# Patient Record
Sex: Female | Born: 1965 | Race: Black or African American | Hispanic: No | Smoking: Current every day smoker
Health system: Southern US, Community
[De-identification: ages and names within clinical notes are randomized; demographics above are authoritative.]

## PROBLEM LIST (undated history)

## (undated) DIAGNOSIS — I10 Essential (primary) hypertension: Secondary | ICD-10-CM

## (undated) HISTORY — PX: ANKLE SURGERY: SHX546

---

## 2018-04-05 ENCOUNTER — Emergency Department (HOSPITAL_COMMUNITY)
Admission: EM | Admit: 2018-04-05 | Discharge: 2018-04-05 | Disposition: A | Discharge status: Home / Self Care | Payer: Medicaid Other | Attending: Emergency Medicine | Admitting: Emergency Medicine

## 2018-04-05 ENCOUNTER — Encounter (HOSPITAL_COMMUNITY): Payer: Self-pay | Admitting: *Deleted

## 2018-04-05 ENCOUNTER — Other Ambulatory Visit: Payer: Self-pay

## 2018-04-05 DIAGNOSIS — M25511 Pain in right shoulder: Secondary | ICD-10-CM | POA: Insufficient documentation

## 2018-04-05 DIAGNOSIS — Y999 Unspecified external cause status: Secondary | ICD-10-CM | POA: Insufficient documentation

## 2018-04-05 DIAGNOSIS — X500XXA Overexertion from strenuous movement or load, initial encounter: Secondary | ICD-10-CM | POA: Insufficient documentation

## 2018-04-05 DIAGNOSIS — S199XXA Unspecified injury of neck, initial encounter: Secondary | ICD-10-CM | POA: Diagnosis present

## 2018-04-05 DIAGNOSIS — Y9389 Activity, other specified: Secondary | ICD-10-CM | POA: Insufficient documentation

## 2018-04-05 DIAGNOSIS — I10 Essential (primary) hypertension: Secondary | ICD-10-CM | POA: Diagnosis not present

## 2018-04-05 DIAGNOSIS — S161XXA Strain of muscle, fascia and tendon at neck level, initial encounter: Secondary | ICD-10-CM | POA: Insufficient documentation

## 2018-04-05 DIAGNOSIS — Y9289 Other specified places as the place of occurrence of the external cause: Secondary | ICD-10-CM | POA: Diagnosis not present

## 2018-04-05 DIAGNOSIS — F17200 Nicotine dependence, unspecified, uncomplicated: Secondary | ICD-10-CM | POA: Insufficient documentation

## 2018-04-05 HISTORY — DX: Essential (primary) hypertension: I10

## 2018-04-05 MED ORDER — METHOCARBAMOL 500 MG PO TABS
500.0000 mg | ORAL_TABLET | Freq: Three times a day (TID) | ORAL | 0 refills | Status: AC
Start: 2018-04-05 — End: 2018-04-12

## 2018-04-05 MED ORDER — IBUPROFEN 800 MG PO TABS: 800.0000 mg | ORAL_TABLET | Freq: Three times a day (TID) | ORAL | 0 refills | Status: DC

## 2018-04-05 NOTE — ED Triage Notes (Signed)
Pt states she has been sleeping on an air mattress and and has posterior neck pain and now has developed pain that now increases with raising her right arm. Pt reports pain for last 2 weeks.

## 2018-04-05 NOTE — Discharge Instructions (Signed)
Ibuprofen 3 times daily, Robaxin 3 times daily as needed, this medication is sedating and should not be used if you are driving or working.  If you develop severe or worsening weakness or numbness return to the emergency department immediately.

## 2018-04-05 NOTE — ED Provider Notes (Signed)
[facility] EMERGENCY DEPARTMENT Provider Note   CSN: [id number] Arrival date & time: 04/05/18  0932     History   Chief Complaint Chief Complaint  Patient presents with  . Neck Pain    HPI Chelsey Beasley is a 52 y.o. female.  HPI  The patient is a 52 year old female, she has a known history of high blood pressure, she reports that she has been having some pain in the right side of her neck for the last several days after sleeping on a new mattress as well as starting a new job where she is doing some heavy lifting.  There is no numbness or weakness of the arm, the pain is worse with moving her head left to right shrugging her shoulder or using her right arm.  There is no symptoms in the legs, no fever, symptoms are persistent and worse with those movements.  Past Medical History:  Diagnosis Date  . Hypertension     There are no active problems to display for this patient.   Past Surgical History:  Procedure Laterality Date  . ANKLE SURGERY    . CESAREAN SECTION       OB History   None      Home Medications    Prior to Admission medications   Medication Sig Start Date End Date Taking? Authorizing Provider  ibuprofen (ADVIL,MOTRIN) 800 MG tablet Take 1 tablet (800 mg total) by mouth 3 (three) times daily. 04/05/18   Eber Hong, MD  methocarbamol (ROBAXIN) 500 MG tablet Take 1 tablet (500 mg total) by mouth 3 (three) times daily for 7 days. 04/05/18 04/12/18  Eber Hong, MD    Family History No family history on file.  Social History Social History   Tobacco Use  . Smoking status: Current Every Day Smoker  . Smokeless tobacco: Never Used  Substance Use Topics  . Alcohol use: Yes    Comment: occ  . Drug use: Never     Allergies   Patient has no known allergies.   Review of Systems Review of Systems  Constitutional: Negative for fever.  Musculoskeletal: Positive for neck pain.  Skin: Negative for rash.  Neurological: Negative  for weakness and numbness.     Physical Exam Updated Vital Signs BP (!) 134/97 (BP Location: Right Arm)   Pulse 90   Temp 98.3 F (36.8 C) (Oral)   Resp 18   LMP  (LMP Unknown)   SpO2 100%   Physical Exam  Constitutional: She appears well-developed and well-nourished.  HENT:  Head: Normocephalic and atraumatic.  Eyes: Conjunctivae are normal. Right eye exhibits no discharge. Left eye exhibits no discharge.  Pulmonary/Chest: Effort normal. No respiratory distress.  Musculoskeletal: She exhibits tenderness ( Tender to palpation in the right trapezius and shoulder).  Neurological: She is alert. Coordination normal.  No spinal tenderness, there is normal strength and sensation of the bilateral upper extremities in all areas.  Normal strength at the shoulders elbows wrists and hands.  Mild pain with external and internal rotation of the shoulder.  Sensation diffusely normal.  Gait normal, speech normal  Skin: Skin is warm and dry. No rash noted. She is not diaphoretic. No erythema.  Psychiatric: She has a normal mood and affect.  Nursing note and vitals reviewed.    ED Treatments / Results  Labs (all labs ordered are listed, but only abnormal results are displayed) Labs Reviewed - No data to display  EKG None  Radiology No results found.  Procedures Procedures (including critical care time)  Medications Ordered in ED Medications - No data to display   Initial Impression / Assessment and Plan / ED Course  I have reviewed the triage vital signs and the nursing notes.  Pertinent labs & imaging results that were available during my care of the patient were reviewed by me and considered in my medical decision making (see chart for details).     Exam is consistent with more muscular strain that it is with a radiculopathy.  Robaxin and ibuprofen, return precautions given, patient stressed understanding  Final Clinical Impressions(s) / ED Diagnoses   Final diagnoses:    Strain of neck muscle, initial encounter    ED Discharge Orders        Ordered    ibuprofen (ADVIL,MOTRIN) 800 MG tablet  3 times daily     04/05/18 1213    methocarbamol (ROBAXIN) 500 MG tablet  3 times daily     04/05/18 1213       Eber Hong, MD 04/05/18 1215

## 2019-06-18 ENCOUNTER — Emergency Department (HOSPITAL_COMMUNITY)
Admission: EM | Admit: 2019-06-18 | Discharge: 2019-06-18 | Disposition: A | Discharge status: Home / Self Care | Payer: Medicaid Other | Attending: Emergency Medicine | Admitting: Emergency Medicine

## 2019-06-18 ENCOUNTER — Encounter (HOSPITAL_COMMUNITY): Payer: Self-pay

## 2019-06-18 ENCOUNTER — Other Ambulatory Visit: Payer: Self-pay

## 2019-06-18 DIAGNOSIS — I10 Essential (primary) hypertension: Secondary | ICD-10-CM | POA: Diagnosis not present

## 2019-06-18 DIAGNOSIS — Y929 Unspecified place or not applicable: Secondary | ICD-10-CM | POA: Diagnosis not present

## 2019-06-18 DIAGNOSIS — X58XXXA Exposure to other specified factors, initial encounter: Secondary | ICD-10-CM | POA: Insufficient documentation

## 2019-06-18 DIAGNOSIS — F17200 Nicotine dependence, unspecified, uncomplicated: Secondary | ICD-10-CM | POA: Diagnosis not present

## 2019-06-18 DIAGNOSIS — Y939 Activity, unspecified: Secondary | ICD-10-CM | POA: Diagnosis not present

## 2019-06-18 DIAGNOSIS — S161XXA Strain of muscle, fascia and tendon at neck level, initial encounter: Secondary | ICD-10-CM | POA: Diagnosis not present

## 2019-06-18 DIAGNOSIS — Y999 Unspecified external cause status: Secondary | ICD-10-CM | POA: Diagnosis not present

## 2019-06-18 DIAGNOSIS — S199XXA Unspecified injury of neck, initial encounter: Secondary | ICD-10-CM | POA: Diagnosis present

## 2019-06-18 MED ORDER — LISINOPRIL-HYDROCHLOROTHIAZIDE 20-25 MG PO TABS: 1.0000 | ORAL_TABLET | Freq: Every day | ORAL | 0 refills | Status: DC

## 2019-06-18 MED ORDER — METHOCARBAMOL 500 MG PO TABS: 500.0000 mg | ORAL_TABLET | Freq: Two times a day (BID) | ORAL | 0 refills | Status: DC

## 2019-06-18 NOTE — Discharge Instructions (Addendum)
You were seen in the ED today for neck pain Please take muscle relaxer as prescribed. It is recommended to take at nighttime to help you sleep. Do not drive while on this medication.  Continue taking Ibuprofen as needed for the pain You can use heat packs for comfort Follow up with PCP. If you do not have one you can follow up with [facility].

## 2019-06-18 NOTE — ED Triage Notes (Signed)
Patient complains of left sided neck pain x 1 week, pain with ROM and pulling to neck when lifts left arm, no trauma

## 2019-06-18 NOTE — ED Provider Notes (Signed)
[place] EMERGENCY DEPARTMENT Provider Note   CSN: [id number] Arrival date & time: 06/18/19  0749    History   Chief Complaint Chief Complaint  Patient presents with  . neck pain x 1 week    HPI Chelsey Beasley is a 53 y.o. female with PMHx HTN who presents to the ED today complaining of gradual onset, constant, achy/tight, left sided neck pain x 5-6 days. Pt reports that a few days ago she woke up feeling fine but a couple of hours later began having pain. She has applied icy hot patches and taken Tylenol/Aleve without relief. 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 She had similar pain about 1 year ago for which she came to the ED. Reports that the muscle relaxers seemed to work. Denies fever, chills, headache, weakness, numbness, paresthesias, or any other associated symptoms.        Past Medical History:  Diagnosis Date  . Hypertension     There are no active problems to display for this patient.   Past Surgical History:  Procedure Laterality Date  . ANKLE SURGERY    . CESAREAN SECTION       OB History   No obstetric history on file.      Home Medications    Prior to Admission medications   Medication Sig Start Date End Date Taking? Authorizing Provider  ibuprofen (ADVIL,MOTRIN) 800 MG tablet Take 1 tablet (800 mg total) by mouth 3 (three) times daily. 04/05/18   Noemi Chapel, MD  lisinopril-hydrochlorothiazide (ZESTORETIC) 20-25 MG tablet Take 1 tablet by mouth daily. 06/18/19 07/18/19  Eustaquio Maize, PA-C  methocarbamol (ROBAXIN) 500 MG tablet Take 1 tablet (500 mg total) by mouth 2 (two) times daily. 06/18/19   Eustaquio Maize, PA-C    Family History No family history on file.  Social History Social History   Tobacco Use  . Smoking status: Current Every Day Smoker  . Smokeless tobacco: Never Used  Substance Use Topics  . Alcohol use: Yes    Comment: occ  .  Drug use: Never     Allergies   Patient has no known allergies.   Review of Systems Review of Systems  Constitutional: Negative for chills and fever.  Musculoskeletal: Positive for neck pain and neck stiffness. Negative for back pain and gait problem.  Skin: Negative for rash.  Neurological: Negative for weakness, numbness and headaches.     Physical Exam Updated Vital Signs BP (!) 171/103 (BP Location: Right Arm)   Pulse 72   Temp 99.1 F (37.3 C) (Oral)   Resp 18   SpO2 99%   Physical Exam Vitals signs and nursing note reviewed.  Constitutional:      Appearance: She is not ill-appearing.  HENT:     Head: Normocephalic and atraumatic.  Eyes:     Conjunctiva/sclera: Conjunctivae normal.  Neck:     Comments: No C midline spinal tenderness. Tenderness present to the left trapezius muscle. ROM limited to the left due to pain.  Cardiovascular:     Rate and Rhythm: Normal rate and regular rhythm.  Pulmonary:     Effort: Pulmonary effort is normal.     Breath sounds: Normal breath sounds.  Musculoskeletal:     Comments: No T or L midline spinal tenderness. No paraspinal muscle tenderness to palpation. ROM intact to BUEs. Strength and sensation intact. Good distal pulses.   Skin:  General: Skin is warm and dry.     Coloration: Skin is not jaundiced.  Neurological:     Mental Status: She is alert.      ED Treatments / Results  Labs (all labs ordered are listed, but only abnormal results are displayed) Labs Reviewed - No data to display  EKG None  Radiology No results found.  Procedures Procedures (including critical care time)  Medications Ordered in ED Medications - No data to display   Initial Impression / Assessment and Plan / ED Course  I have reviewed the triage vital signs and the nursing notes.  Pertinent labs & imaging results that were available during my care of the patient were reviewed by me and considered in my medical decision making  (see chart for details).   53 year old female who presents to the ED with left lateral neck pain/stiffness. No known injury or overuse. No midline spinal tenderness. No red flag symptoms. Has had similar pain in the past after sleeping wrong. Took muscle relaxers with relief. Pt found to be hypertensive today at 171/103. Reports she has been out of her lisinopril/hctz 20-25 for "awhile." Does not currently have a PCP. Will write for muscle relaxer today as well as a months worth of BP medication. Will give [facility] for primary care follow up. Strict return precautions discussed. She is in agreement with plan at this time and stable for discharge home.        Final Clinical Impressions(s) / ED Diagnoses   Final diagnoses:  Neck strain, initial encounter    ED Discharge Orders         Ordered    methocarbamol (ROBAXIN) 500 MG tablet  2 times daily     06/18/19 0815    lisinopril-hydrochlorothiazide (ZESTORETIC) 20-25 MG tablet  Daily     06/18/19 0820           Tanda RockersVenter, Gail Vendetti, PA-C 06/18/19 1601    Alvira MondaySchlossman, Erin, MD 06/19/19 1109

## 2019-06-18 NOTE — ED Triage Notes (Signed)
Patient has not had her BP meds this am

## 2019-06-22 ENCOUNTER — Emergency Department (HOSPITAL_COMMUNITY)
Admission: EM | Admit: 2019-06-22 | Discharge: 2019-06-22 | Discharge status: Against Medical Advice | Payer: Medicaid Other

## 2019-06-22 ENCOUNTER — Ambulatory Visit (HOSPITAL_COMMUNITY)
Admission: EM | Admit: 2019-06-22 | Discharge: 2019-06-22 | Disposition: A | Discharge status: Home / Self Care | Payer: Medicaid Other | Attending: Physician Assistant | Admitting: Physician Assistant

## 2019-06-22 ENCOUNTER — Encounter (HOSPITAL_COMMUNITY): Payer: Self-pay

## 2019-06-22 ENCOUNTER — Other Ambulatory Visit: Payer: Self-pay

## 2019-06-22 ENCOUNTER — Ambulatory Visit (INDEPENDENT_AMBULATORY_CARE_PROVIDER_SITE_OTHER): Payer: Medicaid Other

## 2019-06-22 DIAGNOSIS — M542 Cervicalgia: Secondary | ICD-10-CM

## 2019-06-22 DIAGNOSIS — M436 Torticollis: Secondary | ICD-10-CM

## 2019-06-22 MED ORDER — KETOROLAC TROMETHAMINE 60 MG/2ML IM SOLN
INTRAMUSCULAR | Status: AC
  Filled 2019-06-22: qty 2

## 2019-06-22 MED ORDER — METHYLPREDNISOLONE SODIUM SUCC 125 MG IJ SOLR
INTRAMUSCULAR | Status: AC
  Filled 2019-06-22: qty 2

## 2019-06-22 MED ORDER — PREDNISONE 10 MG PO TABS: ORAL_TABLET | ORAL | 0 refills | Status: DC

## 2019-06-22 MED ORDER — METHYLPREDNISOLONE SODIUM SUCC 125 MG IJ SOLR
125.0000 mg | Freq: Once | INTRAMUSCULAR | Status: AC
  Administered 2019-06-22: 125 mg via INTRAMUSCULAR

## 2019-06-22 MED ORDER — KETOROLAC TROMETHAMINE 60 MG/2ML IM SOLN
60.0000 mg | Freq: Once | INTRAMUSCULAR | Status: AC
  Administered 2019-06-22: 60 mg via INTRAMUSCULAR

## 2019-06-22 NOTE — ED Notes (Signed)
Called pt for triage. No response.  

## 2019-06-22 NOTE — ED Triage Notes (Signed)
Pt states she has neck pain and this has been going on for 1 week or more. Pt went to the ER on 06/18/19. Pt states the meds did not working for her pain.

## 2019-06-22 NOTE — Discharge Instructions (Addendum)
Return if any problems.  See the Orthopaedist for evaluation  °

## 2019-06-25 NOTE — ED Provider Notes (Signed)
[place]    CSN: [id number] Arrival date & time: 06/22/19  1528      History   Chief Complaint Chief Complaint  Patient presents with  . Neck Pain    HPI Dayanis Bergquist is a 53 y.o. female.   The history is provided by the patient. No language interpreter was used.  Neck Pain Pain location:  Generalized neck Quality:  Aching Pain severity:  Moderate Pain is:  Worse during the day Timing:  Constant Progression:  Worsening Chronicity:  New Relieved by:  Nothing Worsened by:  Nothing Ineffective treatments:  None tried Associated symptoms: headaches     Past Medical History:  Diagnosis Date  . Hypertension     There are no active problems to display for this patient.   Past Surgical History:  Procedure Laterality Date  . ANKLE SURGERY    . CESAREAN SECTION      OB History   No obstetric history on file.      Home Medications    Prior to Admission medications   Medication Sig Start Date End Date Taking? Authorizing Provider  ibuprofen (ADVIL,MOTRIN) 800 MG tablet Take 1 tablet (800 mg total) by mouth 3 (three) times daily. 04/05/18   Noemi Chapel, MD  lisinopril-hydrochlorothiazide (ZESTORETIC) 20-25 MG tablet Take 1 tablet by mouth daily. 06/18/19 07/18/19  Eustaquio Maize, PA-C  methocarbamol (ROBAXIN) 500 MG tablet Take 1 tablet (500 mg total) by mouth 2 (two) times daily. 06/18/19   Eustaquio Maize, PA-C  predniSONE (DELTASONE) 10 MG tablet 6,5,4,3,2,1 taper 06/22/19   Fransico Meadow, PA-C    Family History History reviewed. No pertinent family history.  Social History Social History   Tobacco Use  . Smoking status: Current Every Day Smoker  . Smokeless tobacco: Never Used  Substance Use Topics  . Alcohol use: Yes    Comment: occ  . Drug use: Never     Allergies   Patient has no known allergies.   Review of Systems Review of Systems  Musculoskeletal: Positive for neck pain.  Neurological: Positive for headaches.  All other  systems reviewed and are negative.    Physical Exam Triage Vital Signs ED Triage Vitals  Enc Vitals Group     BP 06/22/19 1544 124/84     Pulse Rate 06/22/19 1544 72     Resp 06/22/19 1544 18     Temp --      Temp src --      SpO2 06/22/19 1544 100 %     Weight 06/22/19 1542 150 lb (68 kg)     Height --      Head Circumference --      Peak Flow --      Pain Score 06/22/19 1542 10     Pain Loc --      Pain Edu? --      Excl. in [place]? --    No data found.  Updated Vital Signs BP 124/84 (BP Location: Right Arm)   Pulse 72   Resp 18   Wt 68 kg   SpO2 100%   Visual Acuity Right Eye Distance:   Left Eye Distance:   Bilateral Distance:    Right Eye Near:   Left Eye Near:    Bilateral Near:     Physical Exam Vitals signs and nursing note reviewed.  Constitutional:      Appearance: She is well-developed.  HENT:     Head: Normocephalic.  Neck:     Musculoskeletal:  Normal range of motion. Muscular tenderness present.     Comments: Soreness trapezius area, stiffness with movement Cardiovascular:     Rate and Rhythm: Normal rate.  Pulmonary:     Effort: Pulmonary effort is normal.  Abdominal:     General: There is no distension.  Musculoskeletal: Normal range of motion.  Neurological:     Mental Status: She is alert and oriented to person, place, and time.      UC Treatments / Results  Labs (all labs ordered are listed, but only abnormal results are displayed) Labs Reviewed - No data to display  EKG   Radiology No results found.  Procedures Procedures (including critical care time)  Medications Ordered in UC Medications  ketorolac (TORADOL) injection 60 mg (60 mg Intramuscular Given 06/22/19 1716)  methylPREDNISolone sodium succinate (SOLU-MEDROL) 125 mg/2 mL injection 125 mg (125 mg Intramuscular Given 06/22/19 1716)  methylPREDNISolone sodium succinate (SOLU-MEDROL) 125 mg/2 mL injection (has no administration in time range)  ketorolac (TORADOL) 60  MG/2ML injection (has no administration in time range)    Initial Impression / Assessment and Plan / UC Course  I have reviewed the triage vital signs and the nursing notes.  Pertinent labs & imaging results that were available during my care of the patient were reviewed by me and considered in my medical decision making (see chart for details).     MDM  Pt counseled on treatment and referral  Final Clinical Impressions(s) / UC Diagnoses   Final diagnoses:  Neck pain  Torticollis     Discharge Instructions     Return if any problems.  See the Orthopaedist for evaluation    ED Prescriptions    Medication Sig Dispense Auth. Provider   predniSONE (DELTASONE) 10 MG tablet 6,5,4,3,2,1 taper 30 tablet Elson AreasSofia, Kanon Colunga K, [place]-C     Controlled Substance Prescriptions [place] Controlled Substance Registry consulted? Not Applicable   Elson AreasSofia, Shannie Kontos K, [place]-C 06/25/19 [id number]

## 2021-03-01 IMAGING — DX CERVICAL SPINE - COMPLETE 4+ VIEW
8 series · 8 of 8 positions shown · non-contrast
Comparison: None.

CLINICAL DATA: 53-year-old female with left side neck pain for
greater than 1 week with no known injury.

EXAM:
CERVICAL SPINE - COMPLETE 4+ VIEW

[c-spine lat]
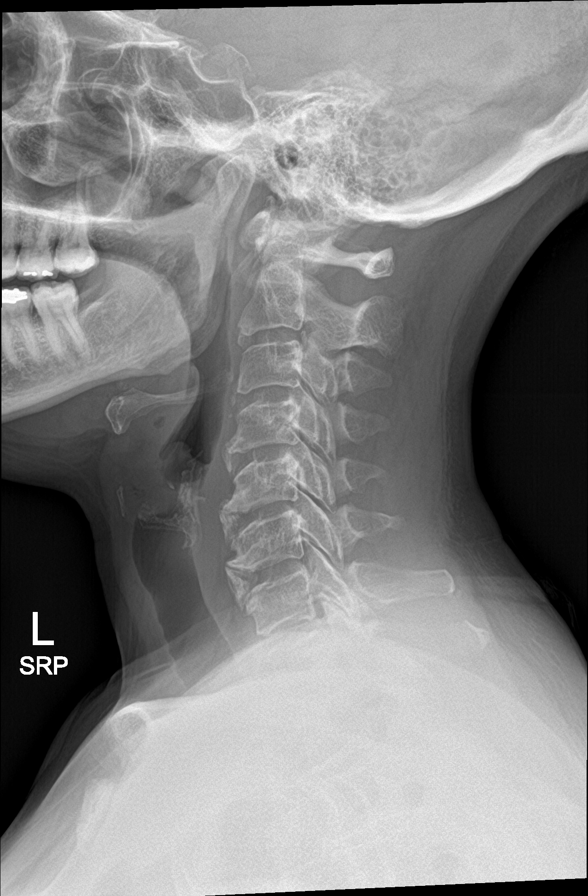

[c-spine obl (1 of 2)]
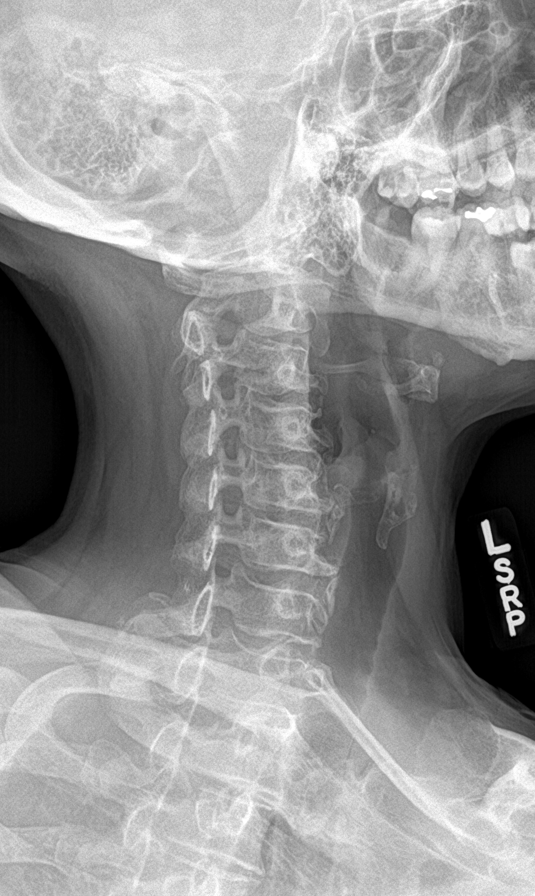

[c-spine obl (2 of 2)]
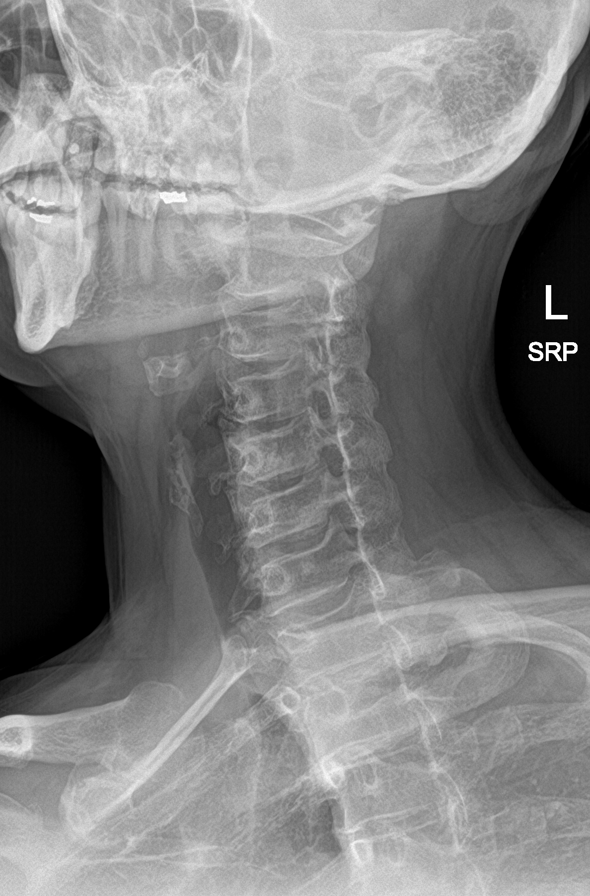

[c-spine ap]
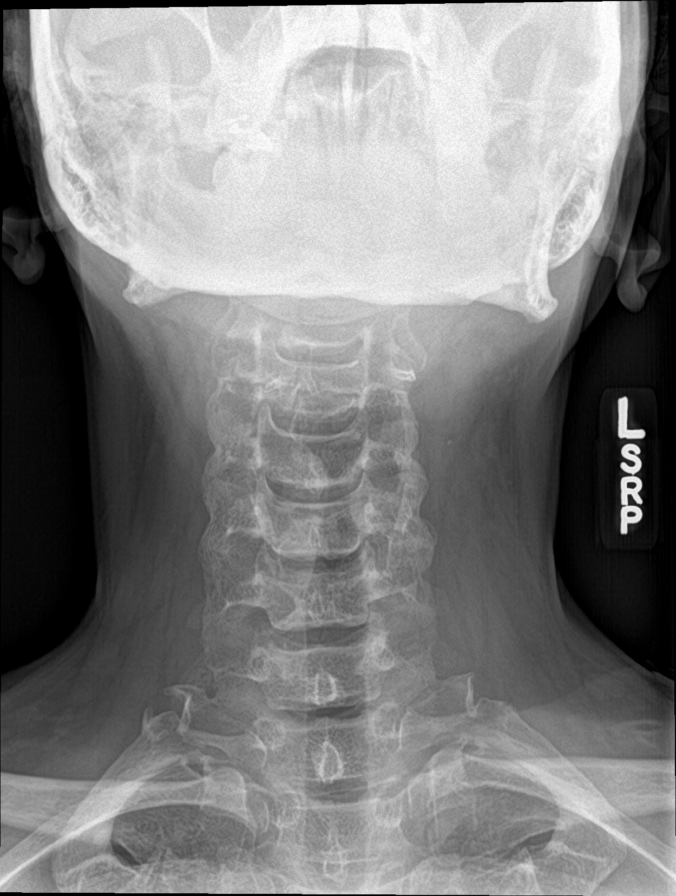

[c-spine open mouth (1 of 3)]
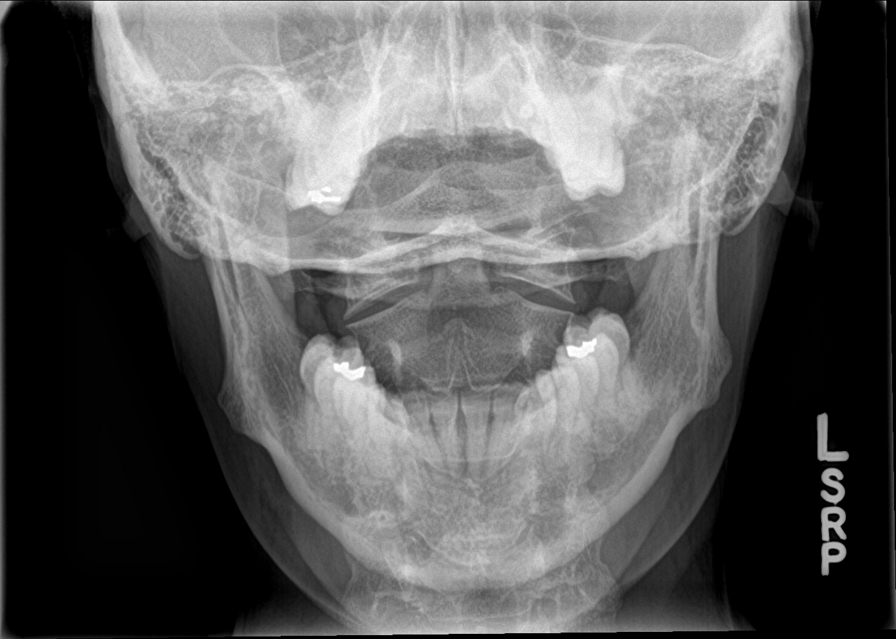

[c-spine swimmers]
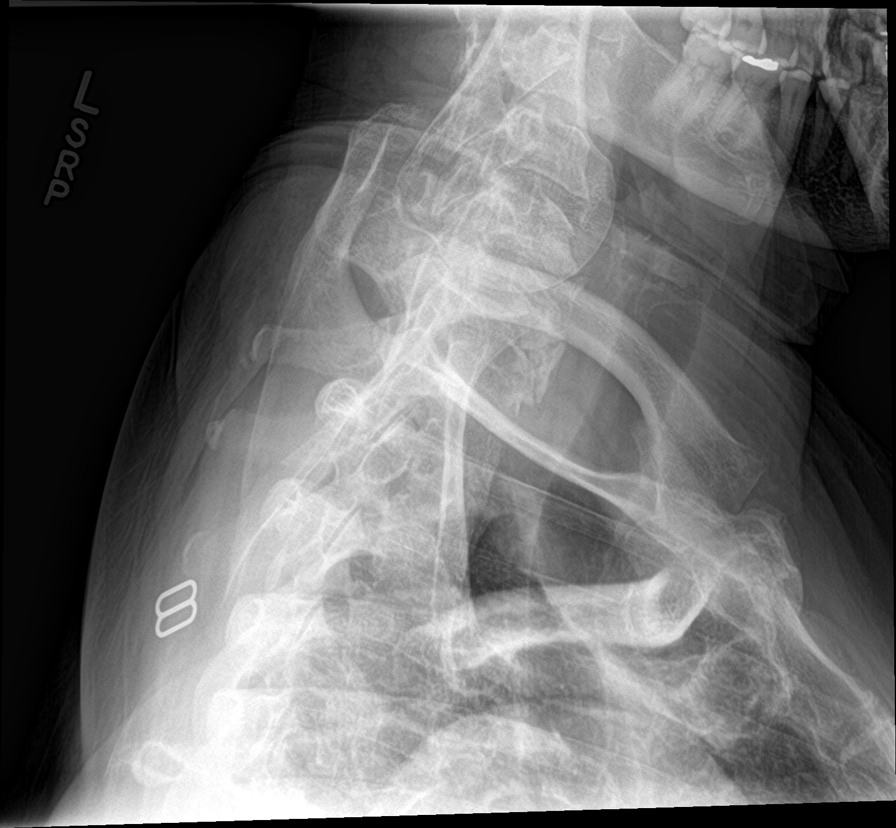

[c-spine open mouth (2 of 3)]
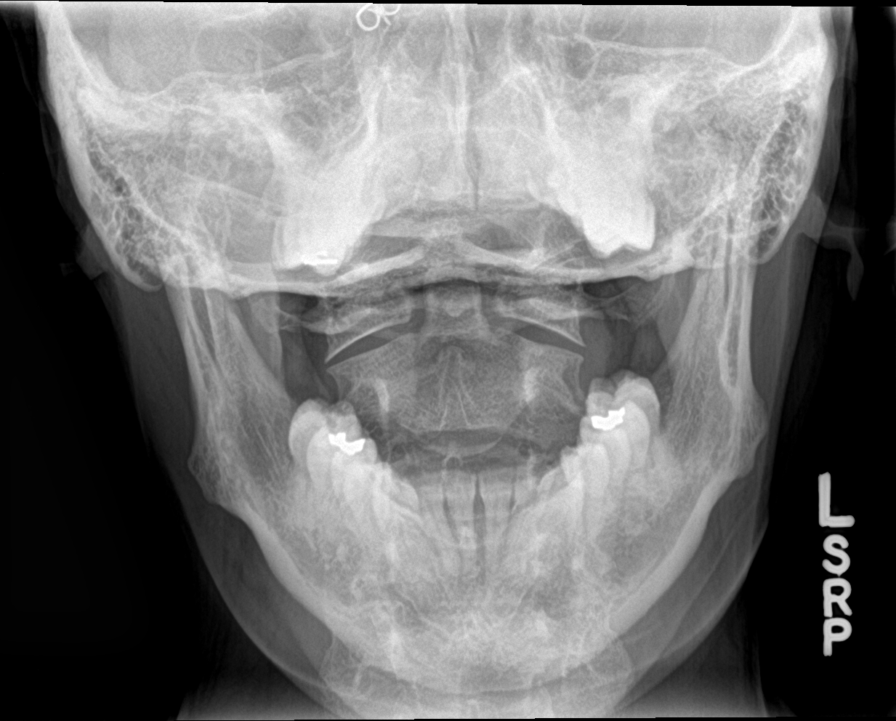

[c-spine open mouth (3 of 3)]
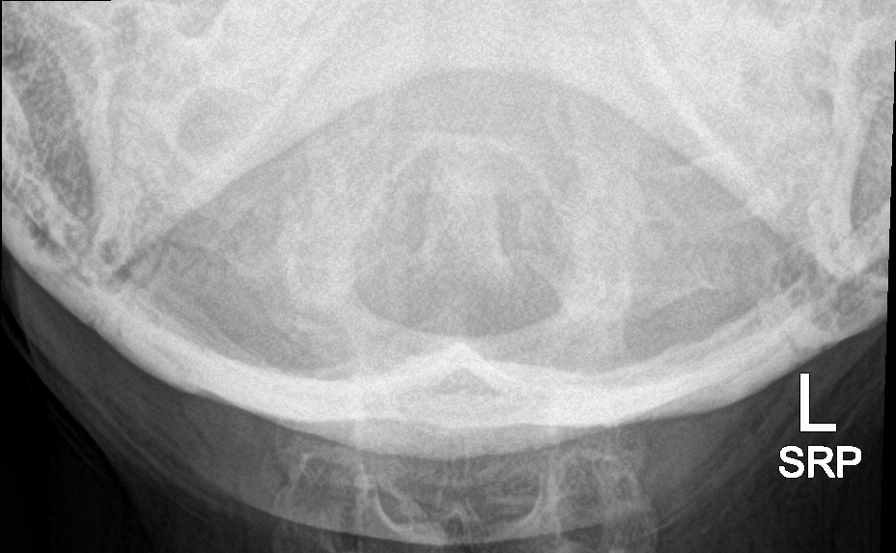

[8 of 8 positions shown; findings below may reference images not displayed]

FINDINGS: Mild straightening of cervical lordosis. Normal prevertebral soft
tissue contour. Bilateral posterior element alignment is within
normal limits. Cervicothoracic junction alignment is within normal
limits. Relatively preserved disc spaces, but bulky anterior
endplate spurring C4 through C7. Normal C1-C2 alignment and joint
spaces. Slight dextroconvex curvature of the cervical spine. No
acute osseous abnormality identified. Negative visible upper chest.
IMPRESSION: 1. No acute osseous abnormality identified in the cervical spine.
2. Bulky anterior endplate osteophytes suggesting Diffuse idiopathic
skeletal hyperostosis (DISH).

## 2022-06-17 ENCOUNTER — Telehealth: Payer: Self-pay

## 2022-06-17 ENCOUNTER — Ambulatory Visit
Admission: EM | Admit: 2022-06-17 | Discharge: 2022-06-17 | Disposition: A | Discharge status: Home / Self Care | Payer: Medicaid Other | Attending: Physician Assistant | Admitting: Physician Assistant

## 2022-06-17 DIAGNOSIS — R519 Headache, unspecified: Secondary | ICD-10-CM

## 2022-06-17 MED ORDER — LISINOPRIL-HYDROCHLOROTHIAZIDE 20-25 MG PO TABS: 1.0000 | ORAL_TABLET | Freq: Every day | ORAL | 0 refills | Status: DC

## 2022-06-17 MED ORDER — KETOROLAC TROMETHAMINE 30 MG/ML IJ SOLN
30.0000 mg | Freq: Once | INTRAMUSCULAR | Status: AC
  Administered 2022-06-17: 30 mg via INTRAMUSCULAR

## 2022-06-17 NOTE — ED Provider Notes (Signed)
[facility]    CSN: [id number] Arrival date & time: 06/17/22  1247      History   Chief Complaint Chief Complaint  Patient presents with   Headache    HPI Chelsey Beasley is a 56 y.o. female.   Patient here today for evaluation of frontal headache she first noticed 3 days ago. She does have history of migraines but has not had one recently. She reports current symptoms are similar to prior migraines as she has photosensitivity. She was concerned her headache was related to her blood pressure being elevated as she needs refill of her blood pressure managing medication. She denies any chest pain or shortness of breath. She has not had any numbness, tingling or weakness. She has not taken any medication for treatment because she reports she has not had any treatment at home.   The history is provided by the patient.    Past Medical History:  Diagnosis Date   Hypertension     There are no problems to display for this patient.   Past Surgical History:  Procedure Laterality Date   ANKLE SURGERY     CESAREAN SECTION      OB History   No obstetric history on file.      Home Medications    Prior to Admission medications   Medication Sig Start Date End Date Taking? Authorizing Provider  ibuprofen (ADVIL,MOTRIN) 800 MG tablet Take 1 tablet (800 mg total) by mouth 3 (three) times daily. 04/05/18   Eber Hong, MD  lisinopril-hydrochlorothiazide (ZESTORETIC) 20-25 MG tablet Take 1 tablet by mouth daily. 06/17/22 07/17/22  Tomi Bamberger, PA-C  methocarbamol (ROBAXIN) 500 MG tablet Take 1 tablet (500 mg total) by mouth 2 (two) times daily. 06/18/19   Tanda Rockers, PA-C  predniSONE (DELTASONE) 10 MG tablet 6,5,4,3,2,1 taper 06/22/19   Elson Areas, PA-C    Family History History reviewed. No pertinent family history.  Social History Social History   Tobacco Use   Smoking status: Every Day   Smokeless tobacco: Never  Substance Use Topics   Alcohol use: Yes     Comment: occ   Drug use: Never     Allergies   Patient has no known allergies.   Review of Systems Review of Systems  Constitutional:  Negative for chills and fever.  Eyes:  Positive for photophobia. Negative for discharge and redness.  Respiratory:  Negative for shortness of breath.   Cardiovascular:  Negative for chest pain.  Gastrointestinal:  Negative for abdominal pain, nausea and vomiting.  Neurological:  Positive for headaches. Negative for facial asymmetry, weakness and numbness.     Physical Exam Triage Vital Signs ED Triage Vitals  Enc Vitals Group     BP      Pulse      Resp      Temp      Temp src      SpO2      Weight      Height      Head Circumference      Peak Flow      Pain Score      Pain Loc      Pain Edu?      Excl. in [place]?    No data found.  Updated Vital Signs BP 137/88 (BP Location: Right Arm)   Pulse 78   Temp 98 F (36.7 C) (Oral)   Resp 18   LMP  (LMP Unknown)   SpO2 98%  Physical Exam Vitals and nursing note reviewed.  Constitutional:      General: She is not in acute distress.    Appearance: Normal appearance. She is not ill-appearing.  HENT:     Head: Normocephalic and atraumatic.  Eyes:     Extraocular Movements: Extraocular movements intact.     Conjunctiva/sclera: Conjunctivae normal.     Pupils: Pupils are equal, round, and reactive to light.  Cardiovascular:     Rate and Rhythm: Normal rate and regular rhythm.     Heart sounds: Normal heart sounds.  Pulmonary:     Effort: Pulmonary effort is normal. No respiratory distress.     Breath sounds: Normal breath sounds. No wheezing, rhonchi or rales.  Neurological:     Mental Status: She is alert and oriented to person, place, and time.  Psychiatric:        Mood and Affect: Mood normal.        Behavior: Behavior normal.        Thought Content: Thought content normal.      UC Treatments / Results  Labs (all labs ordered are listed, but only abnormal results  are displayed) Labs Reviewed - No data to display  EKG   Radiology No results found.  Procedures Procedures (including critical care time)  Medications Ordered in UC Medications  ketorolac (TORADOL) 30 MG/ML injection 30 mg (30 mg Intramuscular Given 06/17/22 1325)    Initial Impression / Assessment and Plan / UC Course  I have reviewed the triage vital signs and the nursing notes.  Pertinent labs & imaging results that were available during my care of the patient were reviewed by me and considered in my medical decision making (see chart for details).    Toradol injection administered in office and antihypertensive refilled. Encouraged follow up with any persistent symptoms and advised she report to ED with any worsening symptoms.  Final Clinical Impressions(s) / UC Diagnoses   Final diagnoses:  Acute nonintractable headache, unspecified headache type   Discharge Instructions   None    ED Prescriptions     Medication Sig Dispense Auth. Provider   lisinopril-hydrochlorothiazide (ZESTORETIC) 20-25 MG tablet Take 1 tablet by mouth daily. 30 tablet Tomi Bamberger, PA-C      PDMP not reviewed this encounter.   Tomi Bamberger, PA-C 06/17/22 1413

## 2022-06-17 NOTE — ED Triage Notes (Signed)
The patient c/o headache and states her BP might be high. The patient states she has not been taking her Zestoretic, needs refill.   Started: a few days ago (Sunday)

## 2022-10-22 ENCOUNTER — Ambulatory Visit
Admission: EM | Admit: 2022-10-22 | Discharge: 2022-10-22 | Disposition: A | Discharge status: Home / Self Care | Payer: Medicaid Other

## 2022-10-22 DIAGNOSIS — M659 Synovitis and tenosynovitis, unspecified: Secondary | ICD-10-CM

## 2022-10-22 NOTE — ED Triage Notes (Signed)
The patient states she has been having pain and lack of mobility (intermittently) to the right thumb.   Started: a few weeks ago    Home interventions: none

## 2022-10-27 DIAGNOSIS — M79644 Pain in right finger(s): Secondary | ICD-10-CM | POA: Diagnosis not present

## 2022-11-26 DIAGNOSIS — M65311 Trigger thumb, right thumb: Secondary | ICD-10-CM | POA: Diagnosis not present

## 2022-12-14 ENCOUNTER — Ambulatory Visit
Admission: EM | Admit: 2022-12-14 | Discharge: 2022-12-14 | Disposition: A | Discharge status: Home / Self Care | Payer: Medicaid Other | Attending: Urgent Care | Admitting: Urgent Care

## 2022-12-14 DIAGNOSIS — J40 Bronchitis, not specified as acute or chronic: Secondary | ICD-10-CM | POA: Diagnosis not present

## 2022-12-14 DIAGNOSIS — J329 Chronic sinusitis, unspecified: Secondary | ICD-10-CM

## 2022-12-14 DIAGNOSIS — F17200 Nicotine dependence, unspecified, uncomplicated: Secondary | ICD-10-CM

## 2022-12-14 MED ORDER — AMOXICILLIN-POT CLAVULANATE 875-125 MG PO TABS: 1.0000 | ORAL_TABLET | Freq: Two times a day (BID) | ORAL | 0 refills | Status: DC

## 2022-12-14 MED ORDER — PROMETHAZINE-DM 6.25-15 MG/5ML PO SYRP: 5.0000 mL | ORAL_SOLUTION | Freq: Three times a day (TID) | ORAL | 0 refills | Status: DC | PRN

## 2022-12-14 MED ORDER — BENZONATATE 100 MG PO CAPS: 100.0000 mg | ORAL_CAPSULE | Freq: Three times a day (TID) | ORAL | 0 refills | Status: DC | PRN

## 2022-12-14 MED ORDER — FLUTICASONE PROPIONATE 50 MCG/ACT NA SUSP: 2.0000 | Freq: Every day | NASAL | 12 refills | Status: DC

## 2022-12-14 MED ORDER — CETIRIZINE HCL 10 MG PO TABS: 10.0000 mg | ORAL_TABLET | Freq: Every day | ORAL | 0 refills | Status: DC

## 2022-12-14 MED ORDER — PREDNISONE 50 MG PO TABS: 50.0000 mg | ORAL_TABLET | Freq: Every day | ORAL | 0 refills | Status: DC

## 2022-12-14 MED ORDER — PSEUDOEPHEDRINE HCL 60 MG PO TABS: 60.0000 mg | ORAL_TABLET | Freq: Three times a day (TID) | ORAL | 0 refills | Status: DC | PRN

## 2022-12-14 NOTE — ED Triage Notes (Signed)
Pt c/p prod cough, nasal congestion, diarrhea, HA-sx intermittent x 1 month-NAD-steady gait

## 2022-12-14 NOTE — ED Provider Notes (Signed)
[facility] URGENT CARE CENTER  Note:  This document was prepared using Systems analyst and may include unintentional dictation errors.  MRN: [id number] DOB: 23-May-1966  Subjective:   Chelsey Beasley is a 57 y.o. female presenting for 1 month history of persistent sinus congestion, productive cough, chills, sinus pain, sweats, diarrhea, sinus headaches, shob, wheezing. No fever, chest pain. Has a history of bronchitis. Smokes 1ppd, marijuana once weekly.   No current facility-administered medications for this encounter.  Current Outpatient Medications:    ibuprofen (ADVIL,MOTRIN) 800 MG tablet, Take 1 tablet (800 mg total) by mouth 3 (three) times daily., Disp: 21 tablet, Rfl: 0   No Known Allergies  Past Medical History:  Diagnosis Date   Hypertension      Past Surgical History:  Procedure Laterality Date   ANKLE SURGERY     CESAREAN SECTION      No family history on file.  Social History   Tobacco Use   Smoking status: Every Day    Types: Cigarettes   Smokeless tobacco: Never  Vaping Use   Vaping Use: Never used  Substance Use Topics   Alcohol use: Yes    Comment: occ   Drug use: Yes    Types: Marijuana    ROS   Objective:   Vitals: BP 131/86 (BP Location: Right Arm)   Pulse 80   Temp 98.6 F (37 C) (Oral)   Resp 18   LMP  (LMP Unknown)   SpO2 97%   Physical Exam Constitutional:      General: She is not in acute distress.    Appearance: Normal appearance. She is well-developed and normal weight. She is not ill-appearing, toxic-appearing or diaphoretic.  HENT:     Head: Normocephalic and atraumatic.     Right Ear: Tympanic membrane, ear canal and external ear normal. No drainage or tenderness. No middle ear effusion. There is no impacted cerumen. Tympanic membrane is not erythematous or bulging.     Left Ear: Tympanic membrane, ear canal and external ear normal. No drainage or tenderness.  No middle ear effusion. There is no  impacted cerumen. Tympanic membrane is not erythematous or bulging.     Nose: Nose normal. No congestion or rhinorrhea.     Mouth/Throat:     Mouth: Mucous membranes are moist. No oral lesions.     Pharynx: No pharyngeal swelling, oropharyngeal exudate, posterior oropharyngeal erythema or uvula swelling.     Tonsils: No tonsillar exudate or tonsillar abscesses.  Eyes:     General: No scleral icterus.       Right eye: No discharge.        Left eye: No discharge.     Extraocular Movements: Extraocular movements intact.     Right eye: Normal extraocular motion.     Left eye: Normal extraocular motion.     Conjunctiva/sclera: Conjunctivae normal.  Cardiovascular:     Rate and Rhythm: Normal rate and regular rhythm.     Heart sounds: Normal heart sounds. No murmur heard.    No friction rub. No gallop.  Pulmonary:     Effort: Pulmonary effort is normal. No respiratory distress.     Breath sounds: No stridor. No wheezing, rhonchi or rales.  Chest:     Chest wall: No tenderness.  Musculoskeletal:     Cervical back: Normal range of motion and neck supple.  Lymphadenopathy:     Cervical: No cervical adenopathy.  Skin:    General: Skin is warm and dry.  Neurological:     General: No focal deficit present.     Mental Status: She is alert and oriented to person, place, and time.  Psychiatric:        Mood and Affect: Mood normal.        Behavior: Behavior normal.     Assessment and Plan :   PDMP not reviewed this encounter.  1. Sinobronchitis   2. Smoker     Deferred imaging given clear cardiopulmonary exam, hemodynamically stable vital signs. Will manage for sinobronchitis with Augmentin, prednisone and general supportive care.  Start Zyrtec and Flonase for daily maintenance of underlying allergic rhinitis secondary to her smoking.  Advised that she start the Flonase when she finishes Augmentin.  Can use pseudoephedrine for supportive care after she finishes prednisone.  Counseled  patient on potential for adverse effects with medications prescribed/recommended today, ER and return-to-clinic precautions discussed, patient verbalized understanding.    Jaynee Eagles, PA-C 12/14/22 1101

## 2022-12-14 NOTE — Discharge Instructions (Addendum)
You have an infection of the sinuses causing bronchitis. Start amoxicillin-clavulanate, prednisone, Zyrtec today. Take Zyrtec every day long term to help your sinuses as long as you are a smoker. Once you finish the 5 days of prednisone you can start using pseudoephedrine as needed. After you finish the amoxicillin, start using Flonase every day as long as you are a smoker (like you will be using Zyrtec). Use the cough medications as needed.

## 2022-12-21 DIAGNOSIS — M47812 Spondylosis without myelopathy or radiculopathy, cervical region: Secondary | ICD-10-CM | POA: Diagnosis not present

## 2022-12-21 DIAGNOSIS — M5412 Radiculopathy, cervical region: Secondary | ICD-10-CM | POA: Diagnosis not present

## 2023-03-19 DIAGNOSIS — M65311 Trigger thumb, right thumb: Secondary | ICD-10-CM | POA: Diagnosis not present

## 2023-04-15 ENCOUNTER — Telehealth: Payer: 59 | Admitting: Physician Assistant

## 2023-04-15 DIAGNOSIS — H66001 Acute suppurative otitis media without spontaneous rupture of ear drum, right ear: Secondary | ICD-10-CM | POA: Diagnosis not present

## 2023-04-15 DIAGNOSIS — J0110 Acute frontal sinusitis, unspecified: Secondary | ICD-10-CM | POA: Diagnosis not present

## 2023-04-15 MED ORDER — BENZONATATE 100 MG PO CAPS: 100.0000 mg | ORAL_CAPSULE | Freq: Three times a day (TID) | ORAL | 0 refills | Status: AC | PRN

## 2023-04-15 MED ORDER — AMOXICILLIN 875 MG PO TABS: 875.0000 mg | ORAL_TABLET | Freq: Two times a day (BID) | ORAL | 0 refills | Status: AC

## 2023-04-15 NOTE — Progress Notes (Signed)
Virtual Visit Consent   Chelsey Beasley, you are scheduled for a virtual visit with a [facility] provider today. Just as with appointments in the office, your consent must be obtained to participate. Your consent will be active for this visit and any virtual visit you may have with one of our providers in the next 365 days. If you have a MyChart account, a copy of this consent can be sent to you electronically.  As this is a virtual visit, video technology does not allow for your provider to perform a traditional examination. This may limit your provider's ability to fully assess your condition. If your provider identifies any concerns that need to be evaluated in person or the need to arrange testing (such as labs, EKG, etc.), we will make arrangements to do so. Although advances in technology are sophisticated, we cannot ensure that it will always work on either your end or our end. If the connection with a video visit is poor, the visit may have to be switched to a telephone visit. With either a video or telephone visit, we are not always able to ensure that we have a secure connection.  By engaging in this virtual visit, you consent to the provision of healthcare and authorize for your insurance to be billed (if applicable) for the services provided during this visit. Depending on your insurance coverage, you may receive a charge related to this service.  I need to obtain your verbal consent now. Are you willing to proceed with your visit today? Chelsey Beasley has provided verbal consent on 04/15/2023 for a virtual visit (video or telephone). Chelsey Beasley, [place]  Date: 04/15/2023 12:08 PM  Virtual Visit via Video Note   I, Chelsey Beasley, connected with  Chelsey Beasley  ([id number], 1966/03/25) on 04/15/23 at 12:00 PM EDT by a video-enabled telemedicine application and verified that I am speaking with the correct person using two identifiers.  Location: Patient: Virtual Visit  Location Patient: Home Provider: Virtual Visit Location Provider: Home Office   I discussed the limitations of evaluation and management by telemedicine and the availability of in person appointments. The patient expressed understanding and agreed to proceed.    History of Present Illness: Chelsey Beasley is a 57 y.o. who identifies as a female who was assigned female at birth, and is being seen today for concern of ear infection after onset of R-sided ear pain, pressure, congestion and fatigue with low-grade fever. Some associated cough with chest congestion. Notes some tenderness with coughing. Denies recent travel or sick contact.   OTC -- Tylenol, Robitussin  HPI: HPI  Problems: There are no problems to display for this patient.   Allergies: No Known Allergies Medications:  Current Outpatient Medications:    amoxicillin (AMOXIL) 875 MG tablet, Take 1 tablet (875 mg total) by mouth 2 (two) times daily for 10 days., Disp: 20 tablet, Rfl: 0  Observations/Objective: Patient is well-developed, well-nourished in no acute distress.  Resting comfortably at home.  Head is normocephalic, atraumatic.  No labored breathing. Speech is clear and coherent with logical content.  Patient is alert and oriented at baseline.   Assessment and Plan: 1. Non-recurrent acute suppurative otitis media of right ear without spontaneous rupture of tympanic membrane - amoxicillin (AMOXIL) 875 MG tablet; Take 1 tablet (875 mg total) by mouth 2 (two) times daily for 10 days.  Dispense: 20 tablet; Refill: 0  2. Acute non-recurrent frontal sinusitis  Rx Amoxicillin per orders. Will send in generic benzonatate to  start as directed.  Increase fluids.  Rest.  Saline nasal spray.  Probiotic.  Mucinex as directed.  Humidifier in bedroom.  Call or return to clinic if symptoms are not improving.   Follow Up Instructions: I discussed the assessment and treatment plan with the patient. The patient was provided an  opportunity to ask questions and all were answered. The patient agreed with the plan and demonstrated an understanding of the instructions.  A copy of instructions were sent to the patient via MyChart unless otherwise noted below.   The patient was advised to call back or seek an in-person evaluation if the symptoms worsen or if the condition fails to improve as anticipated.  Time:  I spent 10 minutes with the patient via telehealth technology discussing the above problems/concerns.    Chelsey Climes, PA-C

## 2023-04-15 NOTE — Patient Instructions (Signed)
Jonah Blue, thank you for joining Piedad Climes, PA-C for today's virtual visit.  While this provider is not your primary care provider (PCP), if your PCP is located in our provider database this encounter information will be shared with them immediately following your visit.   A [facility] MyChart account gives you access to today's visit and all your visits, tests, and labs performed at [facility] " click here if you don't have a [facility] MyChart account or go to [email or web address]  Consent: (Patient) Chelsey Beasley provided verbal consent for this virtual visit at the beginning of the encounter.  Current Medications:  Current Outpatient Medications:    amoxicillin-clavulanate (AUGMENTIN) 875-125 MG tablet, Take 1 tablet by mouth 2 (two) times daily., Disp: 20 tablet, Rfl: 0   benzonatate (TESSALON) 100 MG capsule, Take 1 capsule (100 mg total) by mouth 3 (three) times daily as needed for cough., Disp: 30 capsule, Rfl: 0   cetirizine (ZYRTEC ALLERGY) 10 MG tablet, Take 1 tablet (10 mg total) by mouth daily., Disp: 90 tablet, Rfl: 0   fluticasone (FLONASE) 50 MCG/ACT nasal spray, Place 2 sprays into both nostrils daily., Disp: 16 g, Rfl: 12   ibuprofen (ADVIL,MOTRIN) 800 MG tablet, Take 1 tablet (800 mg total) by mouth 3 (three) times daily., Disp: 21 tablet, Rfl: 0   predniSONE (DELTASONE) 50 MG tablet, Take 1 tablet (50 mg total) by mouth daily with breakfast., Disp: 5 tablet, Rfl: 0   promethazine-dextromethorphan (PROMETHAZINE-DM) 6.25-15 MG/5ML syrup, Take 5 mLs by mouth 3 (three) times daily as needed for cough., Disp: 200 mL, Rfl: 0   pseudoephedrine (SUDAFED) 60 MG tablet, Take 1 tablet (60 mg total) by mouth every 8 (eight) hours as needed for congestion., Disp: 30 tablet, Rfl: 0   Medications ordered in this encounter:  No orders of the defined types were placed in this encounter.    *If you need refills on other medications prior to your next  appointment, please contact your pharmacy*  Follow-Up: Call back or seek an in-person evaluation if the symptoms worsen or if the condition fails to improve as anticipated.  [facility] [phone]  Other Instructions Please take antibiotic as directed.  Increase fluid intake.  Use Saline nasal spray.  Take a daily multivitamin. Take the Tessalon for cough. If not covered by insurance you can go to goodrx.com for coupon, or can start Mucinex-DM over the counter.  Place a humidifier in the bedroom.  Please call or return clinic if symptoms are not improving.  Sinusitis Sinusitis is redness, soreness, and swelling (inflammation) of the paranasal sinuses. Paranasal sinuses are air pockets within the bones of your face (beneath the eyes, the middle of the forehead, or above the eyes). In healthy paranasal sinuses, mucus is able to drain out, and air is able to circulate through them by way of your nose. However, when your paranasal sinuses are inflamed, mucus and air can become trapped. This can allow bacteria and other germs to grow and cause infection. Sinusitis can develop quickly and last only a short time (acute) or continue over a long period (chronic). Sinusitis that lasts for more than 12 weeks is considered chronic.  CAUSES  Causes of sinusitis include: Allergies. Structural abnormalities, such as displacement of the cartilage that separates your nostrils (deviated septum), which can decrease the air flow through your nose and sinuses and affect sinus drainage. Functional abnormalities, such as when the small hairs (cilia) that line your sinuses and help  remove mucus do not work properly or are not present. SYMPTOMS  Symptoms of acute and chronic sinusitis are the same. The primary symptoms are pain and pressure around the affected sinuses. Other symptoms include: Upper toothache. Earache. Headache. Bad breath. Decreased sense of smell and taste. A cough, which worsens  when you are lying flat. Fatigue. Fever. Thick drainage from your nose, which often is green and may contain pus (purulent). Swelling and warmth over the affected sinuses. DIAGNOSIS  Your caregiver will perform a physical exam. During the exam, your caregiver may: Look in your nose for signs of abnormal growths in your nostrils (nasal polyps). Tap over the affected sinus to check for signs of infection. View the inside of your sinuses (endoscopy) with a special imaging device with a light attached (endoscope), which is inserted into your sinuses. If your caregiver suspects that you have chronic sinusitis, one or more of the following tests may be recommended: Allergy tests. Nasal culture A sample of mucus is taken from your nose and sent to a lab and screened for bacteria. Nasal cytology A sample of mucus is taken from your nose and examined by your caregiver to determine if your sinusitis is related to an allergy. TREATMENT  Most cases of acute sinusitis are related to a viral infection and will resolve on their own within 10 days. Sometimes medicines are prescribed to help relieve symptoms (pain medicine, decongestants, nasal steroid sprays, or saline sprays).  However, for sinusitis related to a bacterial infection, your caregiver will prescribe antibiotic medicines. These are medicines that will help kill the bacteria causing the infection.  Rarely, sinusitis is caused by a fungal infection. In theses cases, your caregiver will prescribe antifungal medicine. For some cases of chronic sinusitis, surgery is needed. Generally, these are cases in which sinusitis recurs more than 3 times per year, despite other treatments. HOME CARE INSTRUCTIONS  Drink plenty of water. Water helps thin the mucus so your sinuses can drain more easily. Use a humidifier. Inhale steam 3 to 4 times a day (for example, sit in the bathroom with the shower running). Apply a warm, moist washcloth to your face 3 to 4  times a day, or as directed by your caregiver. Use saline nasal sprays to help moisten and clean your sinuses. Take over-the-counter or prescription medicines for pain, discomfort, or fever only as directed by your caregiver. SEEK IMMEDIATE MEDICAL CARE IF: You have increasing pain or severe headaches. You have nausea, vomiting, or drowsiness. You have swelling around your face. You have vision problems. You have a stiff neck. You have difficulty breathing. MAKE SURE YOU:  Understand these instructions. Will watch your condition. Will get help right away if you are not doing well or get worse. Document Released: 11/02/2005 Document Revised: 01/25/2012 Document Reviewed: 11/17/2011 [facility] Patient Information 2014 [place], [place].    If you have been instructed to have an in-person evaluation today at a local Urgent Care facility, please use the link below. It will take you to a list of all of our available [facility], including address, phone number and hours of operation. Please do not delay care.  [facility] [place] [facility]  If you or a family member do not have a primary care provider, use the link below to schedule a visit and establish care. When you choose a [facility] primary care physician or advanced practice provider, you gain a long-term partner in health. Find a Primary Care Provider  Learn more about [facility]'s in-office and virtual care options: [facility] - Get Care Now

## 2023-09-07 ENCOUNTER — Encounter: Payer: 59 | Admitting: Physician Assistant

## 2023-09-07 NOTE — Progress Notes (Signed)
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 Erroneous encounter.

## 2023-10-05 ENCOUNTER — Encounter: Payer: Self-pay | Admitting: Physician Assistant

## 2023-10-05 ENCOUNTER — Telehealth: Payer: 59

## 2023-10-05 ENCOUNTER — Telehealth: Payer: Self-pay

## 2024-10-19 ENCOUNTER — Telehealth: Admitting: Family Medicine

## 2024-10-19 NOTE — Progress Notes (Signed)
 Pt did not show for visit [facility]

## 2024-10-22 ENCOUNTER — Telehealth: Admitting: Physician Assistant

## 2024-10-22 DIAGNOSIS — M79651 Pain in right thigh: Secondary | ICD-10-CM

## 2024-10-22 NOTE — Progress Notes (Signed)
 Virtual Visit Consent   Chelsey Beasley, you are scheduled for a virtual visit with a [facility] provider today. Just as with appointments in the office, your consent must be obtained to participate. Your consent will be active for this visit and any virtual visit you may have with one of our providers in the next 365 days. If you have a MyChart account, a copy of this consent can be sent to you electronically.  As this is a virtual visit, video technology does not allow for your provider to perform a traditional examination. This may limit your provider's ability to fully assess your condition. If your provider identifies any concerns that need to be evaluated in person or the need to arrange testing (such as labs, EKG, etc.), we will make arrangements to do so. Although advances in technology are sophisticated, we cannot ensure that it will always work on either your end or our end. If the connection with a video visit is poor, the visit may have to be switched to a telephone visit. With either a video or telephone visit, we are not always able to ensure that we have a secure connection.  By engaging in this virtual visit, you consent to the provision of healthcare and authorize for your insurance to be billed (if applicable) for the services provided during this visit. Depending on your insurance coverage, you may receive a charge related to this service.  I need to obtain your verbal consent now. Are you willing to proceed with your visit today? Chelsey Beasley has provided verbal consent on 10/22/2024 for a virtual visit (video or telephone). Kirk GORMAN [name], PA-C  Date: 10/22/2024 8:56 AM   Virtual Visit via Video Note   I, Chelsey Beasley, connected with  Chelsey Beasley  ([id number], 01/17/1966) on 10/22/24 at  8:45 AM EST by a video-enabled telemedicine application and verified that I am speaking with the correct person using two identifiers.  Location: Patient: Virtual Visit Location Patient:  Home Provider: Virtual Visit Location Provider: Home Office   I discussed the limitations of evaluation and management by telemedicine and the availability of in person appointments. The patient expressed understanding and agreed to proceed.    History of Present Illness: Chelsey Beasley is a 58 y.o. who identifies as a female who was assigned female at birth, and is being seen today for right thigh pain.  She reports a one-week history of sharp, shooting right thigh pain that radiates up and down the leg. The pain was initially associated with burning and significant numbness in the area, and she continues to have numbness and tingling. She denies trauma or prior similar symptoms. She notes no swelling, rash, sores, or color change in the leg. Tylenol has not relieved the pain. She smokes.   Problems: There are no active problems to display for this patient.   Allergies: No Known Allergies Medications:  Current Outpatient Medications:    benzonatate  (TESSALON ) 100 MG capsule, Take 1 capsule (100 mg total) by mouth 3 (three) times daily as needed for cough., Disp: 30 capsule, Rfl: 0  Observations/Objective: Patient is well-developed, well-nourished in no acute distress.  Resting comfortably  at home.  Head is normocephalic, atraumatic.  No labored breathing.  Speech is clear and coherent with logical content.  Patient is alert and oriented at baseline.    Assessment and Plan: 1. Acute pain of right thigh (Primary)   Assessment and Plan Right thigh pain with numbness and burning sensation Sharp, shooting pain with  numbness and burning for one week. No trauma or visible changes. Possible vascular etiology related to smoking. Muscle relaxers unlikely effective. Patient encouraged to present to [facility] in AM for further evaluation, does not have a PCP.  Patient understands and agrees  - Advised warm compresses or heating pad. - Recommended stretching exercises. - Provided work note for  missed day.  Follow Up Instructions: I discussed the assessment and treatment plan with the patient. The patient was provided an opportunity to ask questions and all were answered. The patient agreed with the plan and demonstrated an understanding of the instructions.  A copy of instructions were sent to the patient via MyChart unless otherwise noted below.     The patient was advised to call back or seek an in-person evaluation if the symptoms worsen or if the condition fails to improve as anticipated.    Kirk RAMAN Mayers, PA-C

## 2024-10-22 NOTE — Patient Instructions (Addendum)
  Chelsey Beasley, thank you for joining Chelsey Beasley, Chelsey Beasley for today's virtual visit.  While this provider is not your primary care provider (PCP), if your PCP is located in our provider database this encounter information will be shared with them immediately following your visit.   A [facility] MyChart account gives you access to today's visit and all your visits, tests, and labs performed at [facility]  click here if you don't have a [facility] MyChart account or go to [email or web address]  Consent: (Patient) Chelsey Beasley provided verbal consent for this virtual visit at the beginning of the encounter.  Current Medications:  Current Outpatient Medications:    benzonatate  (TESSALON ) 100 MG capsule, Take 1 capsule (100 mg total) by mouth 3 (three) times daily as needed for cough., Disp: 30 capsule, Rfl: 0   Medications ordered in this encounter:  No orders of the defined types were placed in this encounter.    *If you need refills on other medications prior to your next appointment, please contact your pharmacy*  Follow-Up: Call back or seek an in-person evaluation if the symptoms worsen or if the condition fails to improve as anticipated.  [facility] [phone]  Other Instructions We will be at [facility] [address] .  We are parked in the back.   -RIGHT THIGH PAIN WITH NUMBNESS AND BURNING SENSATION: Your thigh pain may be related to issues with your blood vessels, possibly due to smoking. You need to be seen by a specialist for further evaluation, in the meantime you can use warm compresses or a heating pad and try some stretching exercises to help alleviate the pain. A work note has been provided for your missed day.  -TOBACCO USE: Smoking can increase your risk for vascular problems and may be contributing to your thigh symptoms. We discussed the risks of smoking and how it can affect your blood vessels and overall health.   If you have  been instructed to have an in-person evaluation today at a local Urgent Care facility, please use the link below. It will take you to a list of all of our available [facility], including address, phone number and hours of operation. Please do not delay care.  [facility]  If you or a family member do not have a primary care provider, use the link below to schedule a visit and establish care. When you choose a [facility] primary care physician or advanced practice provider, you gain a long-term partner in health. Find a Primary Care Provider  Learn more about [facility]'s in-office and virtual care options: [facility] - Get Care Now

## 2024-10-23 ENCOUNTER — Telehealth: Payer: Self-pay | Admitting: Physician Assistant

## 2024-10-23 NOTE — Telephone Encounter (Signed)
 Contacted pt to schedule follow up visit with [facility]. Pt said she will do a walk up visit tomorrow
